# Patient Record
Sex: Female | Born: 1985 | Race: White | Hispanic: No | Marital: Married | Smoking: Never smoker
Health system: Southern US, Community
[De-identification: ages and names within clinical notes are randomized; demographics above are authoritative.]

## PROBLEM LIST (undated history)

## (undated) HISTORY — PX: APPENDECTOMY: SHX54

---

## 2009-03-21 DIAGNOSIS — J45909 Unspecified asthma, uncomplicated: Secondary | ICD-10-CM | POA: Insufficient documentation

## 2017-01-14 ENCOUNTER — Ambulatory Visit: Payer: Self-pay | Admitting: Medical

## 2017-01-14 ENCOUNTER — Encounter: Payer: Self-pay | Admitting: Medical

## 2017-01-14 VITALS — BP 118/82 | HR 95 | Temp 98.1°F | Resp 16 | Ht 67.0 in | Wt 117.0 lb

## 2017-01-14 DIAGNOSIS — M5442 Lumbago with sciatica, left side: Principal | ICD-10-CM

## 2017-01-14 DIAGNOSIS — G8929 Other chronic pain: Secondary | ICD-10-CM

## 2017-01-14 NOTE — Progress Notes (Signed)
   Subjective:    Patient ID: Selena Hughes, female    DOB: 08/07/1986, 31 y.o.   MRN: [id number]  HPI 31 yo female here for MRI order with [facility] imaging. She has a history of lower back pain with tingling on lateral side of left thigh and tingling on lateral portion of her left foot. No numbness or loss of bowel or bladder. Sitting long or in a "bad chair" increases the back pain.    Review of Systems  Musculoskeletal: Positive for back pain.  see HPI     Objective:   Physical Exam  Constitutional: She is oriented to person, place, and time. She appears well-developed and well-nourished.  HENT:  Head: Normocephalic and atraumatic.  Eyes: EOM are normal. Pupils are equal, round, and reactive to light.  Neurological: She is alert and oriented to person, place, and time.  Skin: Skin is warm and dry.  Nursing note and vitals reviewed.  Pateint standing in room.Easily sits down on exam table, 5/5 strength on flexion and extension of lower legs, neg SLR bilaterally.1-2 + popliteal reflexes ( patient had a hard time relaxing.)          Assessment & Plan:  Lumbar back pain. Will order MRI on lumbar  back per [facility] spine center.

## 2017-01-14 NOTE — Progress Notes (Signed)
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  Ruptured disc at L4-5 now symptoms worsening and needs evaluation.  Referred to [facility] by our office.  PA at [facility] ordered MRI but will cost pt about $1000 and she would like referral to [facility] imaging for MRI at lower cost.

## 2017-01-23 ENCOUNTER — Encounter: Payer: Self-pay | Admitting: Radiology

## 2017-01-23 ENCOUNTER — Ambulatory Visit
Admission: RE | Admit: 2017-01-23 | Discharge: 2017-01-23 | Disposition: A | Discharge status: Home / Self Care | Payer: BLUE CROSS/BLUE SHIELD | Source: Ambulatory Visit | Attending: Medical | Admitting: Medical

## 2017-01-23 DIAGNOSIS — G8929 Other chronic pain: Secondary | ICD-10-CM

## 2017-01-23 DIAGNOSIS — M5442 Lumbago with sciatica, left side: Principal | ICD-10-CM

## 2017-01-25 NOTE — Progress Notes (Signed)
Messaged pt to make sure she was aware of MRI results and to f/u with her spine Dr. In [facility].  She voiced that she was and had no other questions.  BA

## 2017-01-26 NOTE — Progress Notes (Signed)
Per pt, Dr. Isidore Moos in [place] has different fax #.  Refaxed MRI results to April Wikstrom, MD. [phone].  BA

## 2017-01-28 DIAGNOSIS — K3580 Unspecified acute appendicitis: Secondary | ICD-10-CM | POA: Insufficient documentation

## 2017-02-21 ENCOUNTER — Ambulatory Visit: Payer: Self-pay | Admitting: Medical

## 2017-02-21 ENCOUNTER — Encounter: Payer: Self-pay | Admitting: Medical

## 2017-02-21 VITALS — BP 142/92 | HR 101 | Temp 99.4°F | Resp 16 | Ht 68.0 in | Wt 117.0 lb

## 2017-02-21 DIAGNOSIS — R059 Cough, unspecified: Secondary | ICD-10-CM

## 2017-02-21 DIAGNOSIS — R05 Cough: Secondary | ICD-10-CM

## 2017-02-21 DIAGNOSIS — J0110 Acute frontal sinusitis, unspecified: Secondary | ICD-10-CM

## 2017-02-21 DIAGNOSIS — J069 Acute upper respiratory infection, unspecified: Secondary | ICD-10-CM

## 2017-02-21 MED ORDER — AMOXICILLIN 875 MG PO TABS: 875.0000 mg | ORAL_TABLET | Freq: Two times a day (BID) | ORAL | 0 refills | Status: DC

## 2017-02-21 MED ORDER — BENZONATATE 100 MG PO CAPS: 200.0000 mg | ORAL_CAPSULE | Freq: Three times a day (TID) | ORAL | 0 refills | Status: DC | PRN

## 2017-02-21 NOTE — Progress Notes (Signed)
   Subjective:    Patient ID: Selena Hughes, female    DOB: [birth year], 31 y.o.   MRN: [id number]  HPI 31 yo female started on 02/11/2017 with st and chest congestion.  Cough now  productive clear with some green , nasal discharge mixed with green. Ears feeling clogged.She feels it has moved down into her chest more.   Patient shares with me she had a steroid injection into her back  on 02/18/2017 for tingling in leg.She  also had a Lap appy on  02/01/2017. Taking Claritin D and phenylephrine, Robitussin with cough suppressant and expectorant.    Review of Systems  Constitutional: Positive for fever. Negative for chills.  HENT: Positive for congestion, sinus pain, sinus pressure and sneezing. Negative for ear pain and tinnitus.   Eyes: Negative for discharge and itching.  Respiratory: Positive for cough. Negative for shortness of breath and wheezing.   Cardiovascular: Negative for chest pain.  Gastrointestinal: Negative for diarrhea, nausea and vomiting.  Endocrine: Negative for polydipsia, polyphagia and polyuria.  Genitourinary: Negative for hematuria.  Musculoskeletal: Negative for back pain.       Objective:   Physical Exam  Constitutional: She is oriented to person, place, and time. She appears well-developed and well-nourished.  HENT:  Head: Normocephalic and atraumatic.  Right Ear: External ear normal.  Left Ear: External ear normal.  Eyes: Conjunctivae and EOM are normal. Pupils are equal, round, and reactive to light.  Neck: Normal range of motion. Neck supple.  Cardiovascular: Normal heart sounds.  A regularly irregular rhythm present. Tachycardia present.   Pulmonary/Chest: Effort normal and breath sounds normal. She has no wheezes. She has no rales.  Lymphadenopathy:    She has no cervical adenopathy.  Neurological: She is alert and oriented to person, place, and time.  Skin: Skin is warm and dry.  Nursing note and vitals reviewed.         Assessment & Plan:   Sinusitis / URI Amoxil  875 mg  One tablet by mouth twice daily for 10 days #20 no refills. Cough Benzonatate 100 mg two capsules by mouth every  8 hours as needed for cough.#30 no refills. Stop Robitussin patent says it is not working that well. Try to stop Claritin -D and use plain Claritin may take one in the am and one in the pm and stop phenylephrine. Reviewed with patient that both sudafed and phenylephrine  may cause blood pressure and heart rate to be elevated and not to use them together. Patient is tall and thin and either medication may be too much for this patients body mass. Try OTC Ibuprofen 200 mg take 4 tablets ( 800 mg) every 8 hours as needed for headache, take with food. Can continue to use Flonase.  Nasal spray as directed. Return to the clinic in  3-5 days if not impoving.

## 2017-03-14 ENCOUNTER — Other Ambulatory Visit: Payer: Self-pay

## 2017-03-15 ENCOUNTER — Other Ambulatory Visit: Payer: Self-pay | Admitting: *Deleted

## 2017-03-15 DIAGNOSIS — Z0000 Encounter for general adult medical examination without abnormal findings: Secondary | ICD-10-CM

## 2017-03-16 LAB — CMP12+LP+TP+TSH+6AC+CBC/D/PLT
ALBUMIN: 4 g/dL (ref 3.5–5.5)
ALT: 13 IU/L (ref 0–32)
AST: 16 IU/L (ref 0–40)
Albumin/Globulin Ratio: 1.7 (ref 1.2–2.2)
Alkaline Phosphatase: 41 IU/L (ref 39–117)
BASOS ABS: 0 10*3/uL (ref 0.0–0.2)
BUN / CREAT RATIO: 9 (ref 9–23)
BUN: 9 mg/dL (ref 6–20)
Basos: 1 %
Bilirubin Total: 0.3 mg/dL (ref 0.0–1.2)
CALCIUM: 9.2 mg/dL (ref 8.7–10.2)
CHOLESTEROL TOTAL: 185 mg/dL (ref 100–199)
CREATININE: 0.98 mg/dL (ref 0.57–1.00)
Chloride: 105 mmol/L (ref 96–106)
Chol/HDL Ratio: 2.6 ratio (ref 0.0–4.4)
EOS (ABSOLUTE): 0.2 10*3/uL (ref 0.0–0.4)
EOS: 4 %
Free Thyroxine Index: 2.5 (ref 1.2–4.9)
GFR calc Af Amer: 89 mL/min/{1.73_m2} (ref >59)
GFR, EST NON AFRICAN AMERICAN: 77 mL/min/{1.73_m2} (ref >59)
GGT: 22 IU/L (ref 0–60)
Globulin, Total: 2.4 g/dL (ref 1.5–4.5)
Glucose: 95 mg/dL (ref 65–99)
HDL: 72 mg/dL (ref >39)
Hematocrit: 40.2 % (ref 34.0–46.6)
Hemoglobin: 13.3 g/dL (ref 11.1–15.9)
IRON: 69 ug/dL (ref 27–159)
Immature Grans (Abs): 0 10*3/uL (ref 0.0–0.1)
Immature Granulocytes: 0 %
LDH: 173 IU/L (ref 119–226)
LDL Calculated: 92 mg/dL (ref 0–99)
LYMPHS ABS: 1 10*3/uL (ref 0.7–3.1)
Lymphs: 27 %
MCH: 29.1 pg (ref 26.6–33.0)
MCHC: 33.1 g/dL (ref 31.5–35.7)
MCV: 88 fL (ref 79–97)
MONOS ABS: 0.2 10*3/uL (ref 0.1–0.9)
Monocytes: 6 %
Neutrophils Absolute: 2.4 10*3/uL (ref 1.4–7.0)
Neutrophils: 62 %
PHOSPHORUS: 4 mg/dL (ref 2.5–4.5)
PLATELETS: 187 10*3/uL (ref 150–379)
Potassium: 4.1 mmol/L (ref 3.5–5.2)
RBC: 4.57 x10E6/uL (ref 3.77–5.28)
RDW: 13.3 % (ref 12.3–15.4)
Sodium: 141 mmol/L (ref 134–144)
T3 UPTAKE RATIO: 26 % (ref 24–39)
T4 TOTAL: 9.5 ug/dL (ref 4.5–12.0)
TOTAL PROTEIN: 6.4 g/dL (ref 6.0–8.5)
TSH: 1.98 u[IU]/mL (ref 0.450–4.500)
Triglycerides: 107 mg/dL (ref 0–149)
URIC ACID: 5.1 mg/dL (ref 2.5–7.1)
VLDL Cholesterol Cal: 21 mg/dL (ref 5–40)
WBC: 3.8 10*3/uL (ref 3.4–10.8)

## 2017-03-16 LAB — VITAMIN D 25 HYDROXY (VIT D DEFICIENCY, FRACTURES): VIT D 25 HYDROXY: 45.1 ng/mL (ref 30.0–100.0)

## 2017-12-19 ENCOUNTER — Other Ambulatory Visit: Payer: Self-pay

## 2017-12-21 ENCOUNTER — Other Ambulatory Visit: Payer: Self-pay

## 2017-12-21 DIAGNOSIS — M542 Cervicalgia: Principal | ICD-10-CM

## 2017-12-21 DIAGNOSIS — H04123 Dry eye syndrome of bilateral lacrimal glands: Secondary | ICD-10-CM

## 2017-12-21 DIAGNOSIS — M2550 Pain in unspecified joint: Secondary | ICD-10-CM

## 2017-12-21 DIAGNOSIS — G8929 Other chronic pain: Secondary | ICD-10-CM

## 2017-12-27 LAB — CBC WITH DIFFERENTIAL/PLATELET
BASOS ABS: 0 10*3/uL (ref 0.0–0.2)
Basos: 1 %
EOS (ABSOLUTE): 0.1 10*3/uL (ref 0.0–0.4)
Eos: 2 %
Hematocrit: 42.3 % (ref 34.0–46.6)
Hemoglobin: 13.8 g/dL (ref 11.1–15.9)
IMMATURE GRANULOCYTES: 0 %
Immature Grans (Abs): 0 10*3/uL (ref 0.0–0.1)
LYMPHS ABS: 1.3 10*3/uL (ref 0.7–3.1)
Lymphs: 32 %
MCH: 30 pg (ref 26.6–33.0)
MCHC: 32.6 g/dL (ref 31.5–35.7)
MCV: 92 fL (ref 79–97)
MONOS ABS: 0.4 10*3/uL (ref 0.1–0.9)
Monocytes: 9 %
NEUTROS PCT: 56 %
Neutrophils Absolute: 2.4 10*3/uL (ref 1.4–7.0)
PLATELETS: 181 10*3/uL (ref 150–379)
RBC: 4.6 x10E6/uL (ref 3.77–5.28)
RDW: 13.5 % (ref 12.3–15.4)
WBC: 4.1 10*3/uL (ref 3.4–10.8)

## 2017-12-27 LAB — COMPREHENSIVE METABOLIC PANEL
A/G RATIO: 2 (ref 1.2–2.2)
ALK PHOS: 40 IU/L (ref 39–117)
ALT: 14 IU/L (ref 0–32)
AST: 18 IU/L (ref 0–40)
Albumin: 4.3 g/dL (ref 3.5–5.5)
BUN/Creatinine Ratio: 12 (ref 9–23)
BUN: 12 mg/dL (ref 6–20)
Bilirubin Total: 0.3 mg/dL (ref 0.0–1.2)
CALCIUM: 9.4 mg/dL (ref 8.7–10.2)
CO2: 23 mmol/L (ref 20–29)
CREATININE: 0.98 mg/dL (ref 0.57–1.00)
Chloride: 104 mmol/L (ref 96–106)
GFR calc Af Amer: 88 mL/min/{1.73_m2} (ref >59)
GFR calc non Af Amer: 77 mL/min/{1.73_m2} (ref >59)
Globulin, Total: 2.2 g/dL (ref 1.5–4.5)
Glucose: 64 mg/dL — ABNORMAL LOW (ref 65–99)
POTASSIUM: 4.4 mmol/L (ref 3.5–5.2)
SODIUM: 141 mmol/L (ref 134–144)
Total Protein: 6.5 g/dL (ref 6.0–8.5)

## 2017-12-27 LAB — ANA: ANA: NEGATIVE

## 2017-12-27 LAB — RHEUMATOID FACTOR

## 2017-12-27 LAB — HLA-B27 ANTIGEN: HLA B27: NEGATIVE

## 2018-02-15 ENCOUNTER — Other Ambulatory Visit: Payer: Self-pay | Admitting: Family Medicine

## 2018-02-15 DIAGNOSIS — M5412 Radiculopathy, cervical region: Secondary | ICD-10-CM

## 2018-02-15 DIAGNOSIS — M542 Cervicalgia: Secondary | ICD-10-CM

## 2018-02-17 ENCOUNTER — Other Ambulatory Visit: Payer: Self-pay | Admitting: Family Medicine

## 2018-02-17 DIAGNOSIS — R200 Anesthesia of skin: Secondary | ICD-10-CM

## 2018-02-17 DIAGNOSIS — B354 Tinea corporis: Secondary | ICD-10-CM

## 2018-02-28 ENCOUNTER — Ambulatory Visit
Admission: RE | Admit: 2018-02-28 | Discharge: 2018-02-28 | Disposition: A | Discharge status: Home / Self Care | Payer: BLUE CROSS/BLUE SHIELD | Source: Ambulatory Visit | Attending: Family Medicine | Admitting: Family Medicine

## 2018-02-28 DIAGNOSIS — R200 Anesthesia of skin: Secondary | ICD-10-CM

## 2018-02-28 DIAGNOSIS — B354 Tinea corporis: Secondary | ICD-10-CM

## 2018-02-28 MED ORDER — GADOBENATE DIMEGLUMINE 529 MG/ML IV SOLN
11.0000 mL | Freq: Once | INTRAVENOUS | Status: AC | PRN
  Administered 2018-02-28: 11 mL via INTRAVENOUS

## 2018-03-15 DIAGNOSIS — M5020 Other cervical disc displacement, unspecified cervical region: Secondary | ICD-10-CM | POA: Insufficient documentation

## 2018-03-15 DIAGNOSIS — M5136 Other intervertebral disc degeneration, lumbar region: Secondary | ICD-10-CM | POA: Insufficient documentation

## 2018-03-15 DIAGNOSIS — F419 Anxiety disorder, unspecified: Secondary | ICD-10-CM | POA: Insufficient documentation

## 2018-03-15 DIAGNOSIS — M542 Cervicalgia: Secondary | ICD-10-CM

## 2018-03-15 DIAGNOSIS — M21612 Bunion of left foot: Secondary | ICD-10-CM

## 2018-03-15 DIAGNOSIS — G8929 Other chronic pain: Secondary | ICD-10-CM | POA: Insufficient documentation

## 2018-03-15 DIAGNOSIS — M21611 Bunion of right foot: Secondary | ICD-10-CM | POA: Insufficient documentation

## 2018-06-22 DIAGNOSIS — L301 Dyshidrosis [pompholyx]: Secondary | ICD-10-CM | POA: Insufficient documentation

## 2018-08-23 ENCOUNTER — Ambulatory Visit: Payer: Self-pay | Admitting: Medical

## 2018-08-23 ENCOUNTER — Encounter: Payer: Self-pay | Admitting: Medical

## 2018-08-23 VITALS — BP 141/84 | HR 71 | Temp 97.4°F | Resp 16 | Wt 119.6 lb

## 2018-08-23 DIAGNOSIS — M26609 Unspecified temporomandibular joint disorder, unspecified side: Secondary | ICD-10-CM | POA: Insufficient documentation

## 2018-08-23 DIAGNOSIS — Z3A01 Less than 8 weeks gestation of pregnancy: Secondary | ICD-10-CM

## 2018-08-23 NOTE — Progress Notes (Signed)
   Subjective:    Patient ID: Selena Hughes, female    DOB: [birth year], 32 y.o.   MRN: [id number]  HPI 32 yo female in non acute distress. Comes in today for a pregnancy test. She has done a home pregnancy test and it was positive. Last LMP Jul 12, 2018.  Blood pressure (!) 141/84, pulse 71, temperature (!) 97.4 F (36.3 C), temperature source Tympanic, resp. rate 16, weight 119 lb 9.6 oz (54.3 kg), last menstrual period 07/11/2018, SpO2 100 %.  Review of Systems  HENT: Positive for congestion (allergies).   Eyes: Negative for discharge and itching.  Respiratory: Negative for cough and shortness of breath.   Cardiovascular: Negative for chest pain.  Gastrointestinal: Positive for abdominal pain (stomach, sensitive more so since pregancy, cramping, soreness in sides) and nausea. Negative for constipation, diarrhea and vomiting.  Endocrine: Negative for polydipsia, polyphagia and polyuria.  Genitourinary: Negative for dysuria.  Musculoskeletal: Negative for myalgias.  Skin: Positive for rash (history of eczema some on hands and  left flank and back).  Allergic/Immunologic: Positive for environmental allergies. Negative for food allergies.  Neurological: Negative for dizziness, syncope, light-headedness and headaches.  Hematological: Negative for adenopathy.  Psychiatric/Behavioral: Negative for behavioral problems, confusion, self-injury and suicidal ideas.       Objective:   Physical Exam  Constitutional: She is oriented to person, place, and time. She appears well-developed and well-nourished.  HENT:  Head: Normocephalic and atraumatic.  Eyes: Pupils are equal, round, and reactive to light. Conjunctivae and EOM are normal.  Neck: Normal range of motion. Neck supple.  Neurological: She is alert and oriented to person, place, and time.  Skin: Skin is warm and dry.  Psychiatric: She has a normal mood and affect. Her behavior is normal. Judgment and thought content normal.  Nursing  note and vitals reviewed.           Assessment & Plan:  Pregnancy  [facility] Information given on eating, she is taking Folic acid, but not a MVI.Marland Kitchen. Stop Flonase for now, check with OB/GYN/ May use Claritin if needed.  Follow up with your OB/GYN. Patient verbalizes understanding and has no questions at discharge. Return to clinic as needed.

## 2018-08-23 NOTE — Patient Instructions (Signed)
Eating Plan for Pregnant Women While you are pregnant, your body will require additional nutrition to help support your growing baby. It is recommended that you consume:  150 additional calories each day during your first trimester.  300 additional calories each day during your second trimester.  300 additional calories each day during your third trimester.  Eating a healthy, well-balanced diet is very important for your health and for your baby's health. You also have a higher need for some vitamins and minerals, such as folic acid, calcium, iron, and vitamin D. What do I need to know about eating during pregnancy?  Do not try to lose weight or go on a diet during pregnancy.  Choose healthy, nutritious foods. Choose  of a sandwich with a glass of milk instead of a candy bar or a high-calorie sugar-sweetened beverage.  Limit your overall intake of foods that have "empty calories." These are foods that have little nutritional value, such as sweets, desserts, candies, sugar-sweetened beverages, and fried foods.  Eat a variety of foods, especially fruits and vegetables.  Take a prenatal vitamin to help meet the additional needs during pregnancy, specifically for folic acid, iron, calcium, and vitamin D.  Remember to stay active. Ask your health care provider for exercise recommendations that are specific to you.  Practice good food safety and cleanliness, such as washing your hands before you eat and after you prepare raw meat. This helps to prevent foodborne illnesses, such as listeriosis, that can be very dangerous for your baby. Ask your health care provider for more information about listeriosis. What does 150 extra calories look like? Healthy options for an additional 150 calories each day could be any of the following:  Plain low-fat yogurt (6-8 oz) with  cup of berries.  1 apple with 2 teaspoons of peanut butter.  Cut-up vegetables with  cup of hummus.  Low-fat chocolate  milk (8 oz or 1 cup).  1 string cheese with 1 medium orange.   of a peanut butter and jelly sandwich on whole-wheat bread (1 tsp of peanut butter).  For 300 calories, you could eat two of those healthy options each day. What is a healthy amount of weight to gain? The recommended amount of weight for you to gain is based on your pre-pregnancy BMI. If your pre-pregnancy BMI was:  Less than 18 (underweight), you should gain 28-40 lb.  18-24.9 (normal), you should gain 25-35 lb.  25-29.9 (overweight), you should gain 15-25 lb.  Greater than 30 (obese), you should gain 11-20 lb.  What if I am having twins or multiples? Generally, pregnant women who will be having twins or multiples may need to increase their daily calories by 300-600 calories each day. The recommended range for total weight gain is 25-54 lb, depending on your pre-pregnancy BMI. Talk with your health care provider for specific guidance about additional nutritional needs, weight gain, and exercise during your pregnancy. What foods can I eat? Grains Any grains. Try to choose whole grains, such as whole-wheat bread, oatmeal, or brown rice. Vegetables Any vegetables. Try to eat a variety of colors and types of vegetables to get a full range of vitamins and minerals. Remember to wash your vegetables well before eating. Fruits Any fruits. Try to eat a variety of colors and types of fruit to get a full range of vitamins and minerals. Remember to wash your fruits well before eating. Meats and Other Protein Sources Lean meats, including chicken, turkey, fish, and lean cuts of beef, veal,   or pork. Make sure that all meats are cooked to "well done." Tofu. Tempeh. Beans. Eggs. Peanut butter and other nut butters. Seafood, such as shrimp, crab, and lobster. If you choose fish, select types that are higher in omega-3 fatty acids, including salmon, herring, mussels, trout, sardines, and pollock. Make sure that all meats are cooked to  food-safe temperatures. Dairy Pasteurized milk and milk alternatives. Pasteurized yogurt and pasteurized cheese. Cottage cheese. Sour cream. Beverages Water. Juices that contain 100% fruit juice or vegetable juice. Caffeine-free teas and decaffeinated coffee. Drinks that contain caffeine are okay to drink, but it is better to avoid caffeine. Keep your total caffeine intake to less than 200 mg each day (12 oz of coffee, tea, or soda) or as directed by your health care provider. Condiments Any pasteurized condiments. Sweets and Desserts Any sweets and desserts. Fats and Oils Any fats and oils. The items listed above may not be a complete list of recommended foods or beverages. Contact your dietitian for more options. What foods are not recommended? Vegetables Unpasteurized (raw) vegetable juices. Fruits Unpasteurized (raw) fruit juices. Meats and Other Protein Sources Cured meats that have nitrates, such as bacon, salami, and hotdogs. Luncheon meats, bologna, or other deli meats (unless they are reheated until they are steaming hot). Refrigerated pate, meat spreads from a meat counter, smoked seafood that is found in the refrigerated section of a store. Raw fish, such as sushi or sashimi. High mercury content fish, such as tilefish, shark, swordfish, and king mackerel. Raw meats, such as tuna or beef tartare. Undercooked meats and poultry. Make sure that all meats are cooked to food-safe temperatures. Dairy Unpasteurized (raw) milk and any foods that have raw milk in them. Soft cheeses, such as feta, queso blanco, queso fresco, Brie, Camembert cheeses, blue-veined cheeses, and Panela cheese (unless it is made with pasteurized milk, which must be stated on the label). Beverages Alcohol. Sugar-sweetened beverages, such as sodas, teas, or energy drinks. Condiments Homemade fermented foods and drinks, such as pickles, sauerkraut, or kombucha drinks. (Store-bought pasteurized versions of these are  okay.) Other Salads that are made in the store, such as ham salad, chicken salad, egg salad, tuna salad, and seafood salad. The items listed above may not be a complete list of foods and beverages to avoid. Contact your dietitian for more information. This information is not intended to replace advice given to you by your health care provider. Make sure you discuss any questions you have with your health care provider. Document Released: 07/12/2014 Document Revised: 03/04/2016 Document Reviewed: 03/12/2014 Elsevier Interactive Patient Education  2018 Elsevier Inc.   

## 2019-06-15 DIAGNOSIS — Z975 Presence of (intrauterine) contraceptive device: Secondary | ICD-10-CM | POA: Insufficient documentation

## 2019-07-09 DIAGNOSIS — M2021 Hallux rigidus, right foot: Secondary | ICD-10-CM | POA: Insufficient documentation

## 2019-12-21 ENCOUNTER — Other Ambulatory Visit: Payer: Self-pay | Admitting: Physical Medicine and Rehabilitation

## 2019-12-21 DIAGNOSIS — M4807 Spinal stenosis, lumbosacral region: Secondary | ICD-10-CM

## 2020-01-03 ENCOUNTER — Ambulatory Visit
Admission: RE | Admit: 2020-01-03 | Discharge: 2020-01-03 | Disposition: A | Discharge status: Home / Self Care | Payer: BC Managed Care – PPO | Source: Ambulatory Visit | Attending: Physical Medicine and Rehabilitation | Admitting: Physical Medicine and Rehabilitation

## 2020-01-03 ENCOUNTER — Other Ambulatory Visit: Payer: Self-pay

## 2020-01-03 DIAGNOSIS — M4807 Spinal stenosis, lumbosacral region: Secondary | ICD-10-CM

## 2020-06-19 ENCOUNTER — Telehealth: Payer: Self-pay | Admitting: Medical

## 2020-06-19 ENCOUNTER — Telehealth: Payer: Self-pay

## 2020-06-19 NOTE — Telephone Encounter (Signed)
Pt called stating she started with a head cold last Friday, 9/3; PCR test done Friday and was negative; using Sudafed and Nyquil; vaccinated; denies any fever, cough, shortness of breath; daughter also has symptoms and being treated for a bacterial infection and on antibiotics; daughter's RSV and covid tests were negative as well yesterday; denies any exposure to anyone with Covid; spoke with H.[facility] PAC and virtual visit offered; pt unable to have virtual visit today; pt made virtual appointment on Friday morning

## 2020-06-20 ENCOUNTER — Other Ambulatory Visit: Payer: Self-pay

## 2020-06-20 ENCOUNTER — Encounter: Payer: Self-pay | Admitting: Registered Nurse

## 2020-06-20 ENCOUNTER — Telehealth: Payer: BC Managed Care – PPO | Admitting: Registered Nurse

## 2020-06-20 DIAGNOSIS — J019 Acute sinusitis, unspecified: Secondary | ICD-10-CM

## 2020-06-20 MED ORDER — AMOXICILLIN 875 MG PO TABS: 875.0000 mg | ORAL_TABLET | Freq: Two times a day (BID) | ORAL | 0 refills | Status: AC

## 2020-06-20 MED ORDER — FLUTICASONE PROPIONATE 50 MCG/ACT NA SUSP: 1.0000 | Freq: Two times a day (BID) | NASAL | 0 refills | Status: AC

## 2020-06-20 MED ORDER — AFRIN NASAL SPRAY 0.05 % NA SOLN: 1.0000 | Freq: Two times a day (BID) | NASAL | 0 refills | Status: AC

## 2020-06-20 MED ORDER — SALINE SPRAY 0.65 % NA SOLN: 2.0000 | NASAL | 0 refills | Status: AC

## 2020-06-20 NOTE — Progress Notes (Signed)
Subjective:    Patient ID: Selena Hughes, female    DOB: [birth year], 34 y.o.   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  She does walk around and interact with students in classroom but not closer than 6 feet greater than 15 minutes per 24 hours with each individual student.  Masks are worn by faculty and students.  She hasn't had close contact in office.  Nasal congestion and pressure in head Monday started and building throughout the week.  Initially mucous clear to yellow now yellow green.  Daughter pink eye last Thursday at daycare went away came back, Tuesday fever at daycare, pediatrician Wed diagnosed otitis negative for RSV/covid/flu  Has tried claritin D, flonase.  Denied cough issues.  Denied breastfeeding.  During allergy season uses plain claritin prn po daily.  Denied loss of taste/smell, body aches, fever, chills, dyspnea/sob/wheezing/chest pain.  Ears are clogged up.  Patient consented to video/telephone visit.  This visit was conducted entirely via telephone.  I spent 17.5 minutes on the telephone with patient.  Patient was unable to connect via video. The visit was completed via audio only.     Review of Systems  Constitutional: Negative for activity change, appetite change, chills, diaphoresis, fatigue and fever.  HENT: Positive for congestion, postnasal drip, rhinorrhea, sinus pressure, sinus pain and sore throat. Negative for ear discharge, ear pain, mouth sores, trouble swallowing and voice change.   Eyes: Negative for photophobia and visual disturbance.  Respiratory: Negative for cough, shortness of breath, wheezing and stridor.   Cardiovascular: Negative for chest pain.  Gastrointestinal: Negative for diarrhea and  vomiting.  Endocrine: Negative for cold intolerance and heat intolerance.  Musculoskeletal: Negative for arthralgias, gait problem, myalgias, neck pain and neck stiffness.  Allergic/Immunologic: Positive for environmental allergies. Negative for food allergies.  Neurological: Negative for dizziness, tremors, syncope, facial asymmetry, speech difficulty, weakness, light-headedness and headaches.  Hematological: Negative for adenopathy. Does not bruise/bleed easily.  Psychiatric/Behavioral: Negative for agitation, confusion and sleep disturbance.       Objective:   Physical Exam Nursing note reviewed.  Constitutional:      General: She is awake. She is not in acute distress. HENT:     Right Ear: Hearing normal.     Left Ear: Hearing normal.     Nose: Mucosal edema, congestion and rhinorrhea present.  Eyes:     General: Vision grossly intact.  Neck:     Trachea: Trachea and phonation normal.  Pulmonary:     Effort: Pulmonary effort is normal. No respiratory distress.     Breath sounds: Normal breath sounds and air entry. No stridor. No wheezing.     Comments: Patient spoke full sentences without difficulty; no cough audible during telephone conversation.  Frequent nasal sniffing noted/audible. Neurological:     Mental Status: She is alert and oriented to person, place, and time.  Psychiatric:        Attention and Perception: Attention and perception normal.        Mood and Affect: Mood normal.        Speech: Speech normal.        Behavior: Behavior normal. Behavior is cooperative.        Thought Content: Thought content normal.        Cognition  and Memory: Cognition and memory normal.        Judgment: Judgment normal.           Assessment & Plan:  A-acute rhinosinusitis, eustachian tube dysfunction bilateral  P-Restart flonase 1 spray each nostril BID, saline 2 sprays each nostril q2h wa prn congestion OTC/at home supply.  If no improvement with 48 hours of saline and  flonase use start amoxicillin 875mg  po BID x 10 days #20 RF0 electronic Rx to her pharmacy of choice.  Discussed administration nasal saline first wait 15-30 minutes then blow nose.  Should feel liquid run down back of throat.  Then instill flonase light inhale shouldn't feel/taste liquid in back of throat if yes sniffed/inhaled too hard try again lighter inhale.  Goal to have medicine stay in nostrils/sinuses.  Denied personal or family history of ENT cancer.  Shower BID especially prior to bed. No evidence of systemic bacterial infection, non toxic and well hydrated.  I do not see where any further testing or imaging is necessary at this time.   I will suggest supportive care, rest, good hygiene and encourage the patient to take adequate fluids.  The patient is to return to clinic or EMERGENCY ROOM if symptoms worsen or change significantly.  Patient denied need for work excuse.  Exitcare handout on covid 19, viral URI,  sinusitis and sinus rinse sent to patient my chart.  Patient verbalized agreement and understanding of treatment plan and had no further questions at this time.   P2:  Hand washing and cover cough   No evidence of invasive bacterial infection, non toxic and well hydrated.  I do not see where any further testing or imaging is necessary at this time.   I will suggest supportive care, rest, good hygiene and encourage the patient to take adequate fluids.  The patient is to return to clinic or EMERGENCY ROOM if symptoms worsen or change significantly e.g. ear pain, fever, purulent discharge from ears or bleeding.  Exitcare handout on eustachian tube dysfunction sent to my chart.  Post nasal drip irritates throat/causes swelling blocks eustachian tubes from draining and fluid fills up middle ear.  Bacteria/viruses can grow in fluid and with moving head tube compressed and increases pressure in tube/ear worsening pain.  Studies show will take 30 days for fluid to resolve after post nasal drip  controlled with nasal steroid/antihistamine. Antibiotics and steroids do not speed up fluid removal.  Patient verbalized agreement and understanding of treatment plan and had no further questions at this time.  Discussed high rate of transmission in community.  Fully vaccinated.  Test last week day 1 symptoms negative.  Daughter day 7 symptoms negative for flu/RSV/covid.  Monitor for symptoms of covid e.g. runny nose, sore throat, headache, loss of taste/smell, nausea/vomiting/diarrhea, fever/chills, body aches, cough, shortness of breath/dyspnea. Patient to notify clinic staff if new symptoms develop especially difficulty breathing, loss of taste/smell, body aches, and/or fever.  Consider repeat covid testing if these symptoms occur.  Discussed clinic open until 1200 today and she can send me my chart message over weekend if new concerns. Current guidance states test day 3-5 of covid symptoms with delta variant.  Continue to protect self with mask wear, hand sanitizing washing, avoid touching face and maintaining social distancing 6 feet or greater.  Patient verbalized understanding of information/instructions, agreed with plan of care and had no further questions at this time.

## 2020-06-20 NOTE — Patient Instructions (Addendum)
Eustachian Tube Dysfunction  Eustachian tube dysfunction refers to a condition in which a blockage develops in the narrow passage that connects the middle ear to the back of the nose (eustachian tube). The eustachian tube regulates air pressure in the middle ear by letting air move between the ear and nose. It also helps to drain fluid from the middle ear space. Eustachian tube dysfunction can affect one or both ears. When the eustachian tube does not function properly, air pressure, fluid, or both can build up in the middle ear. What are the causes? This condition occurs when the eustachian tube becomes blocked or cannot open normally. Common causes of this condition include:  Ear infections.  Colds and other infections that affect the nose, mouth, and throat (upper respiratory tract).  Allergies.  Irritation from cigarette smoke.  Irritation from stomach acid coming up into the esophagus (gastroesophageal reflux). The esophagus is the tube that carries food from the mouth to the stomach.  Sudden changes in air pressure, such as from descending in an airplane or scuba diving.  Abnormal growths in the nose or throat, such as: ? Growths that line the nose (nasal polyps). ? Abnormal growth of cells (tumors). ? Enlarged tissue at the back of the throat (adenoids). What increases the risk? You are more likely to develop this condition if:  You smoke.  You are overweight.  You are a child who has: ? Certain birth defects of the mouth, such as cleft palate. ? Large tonsils or adenoids. What are the signs or symptoms? Common symptoms of this condition include:  A feeling of fullness in the ear.  Ear pain.  Clicking or popping noises in the ear.  Ringing in the ear.  Hearing loss.  Loss of balance.  Dizziness. Symptoms may get worse when the air pressure around you changes, such as when you travel to an area of high elevation, fly on an airplane, or go scuba diving. How is  this diagnosed? This condition may be diagnosed based on:  Your symptoms.  A physical exam of your ears, nose, and throat.  Tests, such as those that measure: ? The movement of your eardrum (tympanogram). ? Your hearing (audiometry). How is this treated? Treatment depends on the cause and severity of your condition.  In mild cases, you may relieve your symptoms by moving air into your ears. This is called "popping the ears."  In more severe cases, or if you have symptoms of fluid in your ears, treatment may include: ? Medicines to relieve congestion (decongestants). ? Medicines that treat allergies (antihistamines). ? Nasal sprays or ear drops that contain medicines that reduce swelling (steroids). ? A procedure to drain the fluid in your eardrum (myringotomy). In this procedure, a small tube is placed in the eardrum to:  Drain the fluid.  Restore the air in the middle ear space. ? A procedure to insert a balloon device through the nose to inflate the opening of the eustachian tube (balloon dilation). Follow these instructions at home: Lifestyle  Do not do any of the following until your health care provider approves: ? Travel to high altitudes. ? Fly in airplanes. ? Work in a pressurized cabin or room. ? Scuba dive.  Do not use any products that contain nicotine or tobacco, such as cigarettes and e-cigarettes. If you need help quitting, ask your health care provider.  Keep your ears dry. Wear fitted earplugs during showering and bathing. Dry your ears completely after. General instructions  Take over-the-counter   and prescription medicines only as told by your health care provider.  Use techniques to help pop your ears as recommended by your health care provider. These may include: ? Chewing gum. ? Yawning. ? Frequent, forceful swallowing. ? Closing your mouth, holding your nose closed, and gently blowing as if you are trying to blow air out of your nose.  Keep all  follow-up visits as told by your health care provider. This is important. Contact a health care provider if:  Your symptoms do not go away after treatment.  Your symptoms come back after treatment.  You are unable to pop your ears.  You have: ? A fever. ? Pain in your ear. ? Pain in your head or neck. ? Fluid draining from your ear.  Your hearing suddenly changes.  You become very dizzy.  You lose your balance. Summary  Eustachian tube dysfunction refers to a condition in which a blockage develops in the eustachian tube.  It can be caused by ear infections, allergies, inhaled irritants, or abnormal growths in the nose or throat.  Symptoms include ear pain, hearing loss, or ringing in the ears.  Mild cases are treated with maneuvers to unblock the ears, such as yawning or ear popping.  Severe cases are treated with medicines. Surgery may also be done (rare). This information is not intended to replace advice given to you by your health care provider. Make sure you discuss any questions you have with your health care provider. Document Revised: 01/17/2018 Document Reviewed: 01/17/2018 Elsevier Patient Education  2020 [employer] Viral Respiratory Infection A respiratory infection is an illness that affects part of the respiratory system, such as the lungs, nose, or throat. A respiratory infection that is caused by a virus is called a viral respiratory infection. Common types of viral respiratory infections include:  A cold.  The flu (influenza).  A respiratory syncytial virus (RSV) infection. What are the causes? This condition is caused by a virus. What are the signs or symptoms? Symptoms of this condition include:  A stuffy or runny nose.  Yellow or green nasal discharge.  A cough.  Sneezing.  Fatigue.  Achy muscles.  A sore throat.  Sweating or chills.  A fever.  A headache. How is this diagnosed? This condition may be diagnosed based  on:  Your symptoms.  A physical exam.  Testing of nasal swabs. How is this treated? This condition may be treated with medicines, such as:  Antiviral medicine. This may shorten the length of time a person has symptoms.  Expectorants. These make it easier to cough up mucus.  Decongestant nasal sprays.  Acetaminophen or NSAIDs to relieve fever and pain. Antibiotic medicines are not prescribed for viral infections. This is because antibiotics are designed to kill bacteria. They are not effective against viruses. Follow these instructions at home:  Managing pain and congestion  Take over-the-counter and prescription medicines only as told by your health care provider.  If you have a sore throat, gargle with a salt-water mixture 3-4 times a day or as needed. To make a salt-water mixture, completely dissolve -1 tsp of salt in 1 cup of warm water.  Use nose drops made from salt water to ease congestion and soften raw skin around your nose.  Drink enough fluid to keep your urine pale yellow. This helps prevent dehydration and helps loosen up mucus. General instructions  Rest as much as possible.  Do not drink alcohol.  Do not use any products that contain nicotine  or tobacco, such as cigarettes and e-cigarettes. If you need help quitting, ask your health care provider.  Keep all follow-up visits as told by your health care provider. This is important. How is this prevented?   Get an annual flu shot. You may get the flu shot in late summer, fall, or winter. Ask your health care provider when you should get your flu shot.  Avoid exposing others to your respiratory infection. ? Stay home from work or school as told by your health care provider. ? Wash your hands with soap and water often, especially after you cough or sneeze. If soap and water are not available, use alcohol-based hand sanitizer.  Avoid contact with people who are sick during cold and flu season. This is generally  fall and winter. Contact a health care provider if:  Your symptoms last for 10 days or longer.  Your symptoms get worse over time.  You have a fever.  You have severe sinus pain in your face or forehead.  The glands in your jaw or neck become very swollen. Get help right away if you:  Feel pain or pressure in your chest.  Have shortness of breath.  Faint or feel like you will faint.  Have severe and persistent vomiting.  Feel confused or disoriented. Summary  A respiratory infection is an illness that affects part of the respiratory system, such as the lungs, nose, or throat. A respiratory infection that is caused by a virus is called a viral respiratory infection.  Common types of viral respiratory infections are a cold, influenza, and respiratory syncytial virus (RSV) infection.  Symptoms of this condition include a stuffy or runny nose, cough, sneezing, fatigue, achy muscles, sore throat, and fevers or chills.  Antibiotic medicines are not prescribed for viral infections. This is because antibiotics are designed to kill bacteria. They are not effective against viruses. This information is not intended to replace advice given to you by your health care provider. Make sure you discuss any questions you have with your health care provider. Document Revised: 10/05/2018 Document Reviewed: 11/07/2017 Elsevier Patient Education  2020 [employer]. How to Perform a Sinus Rinse A sinus rinse is a home treatment that is used to rinse your sinuses with a sterile mixture of salt and water (saline solution). Sinuses are air-filled spaces in your skull behind the bones of your face and forehead that open into your nasal cavity. A sinus rinse can help to clear mucus, dirt, dust, or pollen from your nasal cavity. You may do a sinus rinse when you have a cold, a virus, nasal allergy symptoms, a sinus infection, or stuffiness in your nose or sinuses. Talk with your health care provider about  whether a sinus rinse might help you. What are the risks? A sinus rinse is generally safe and effective. However, there are a few risks, which include:  A burning sensation in your sinuses. This may happen if you do not make the saline solution as directed. Be sure to follow all directions when making the saline solution.  Nasal irritation.  Infection from contaminated water. This is rare, but possible. Do not do a sinus rinse if you have had ear or nasal surgery, ear infection, or blocked ears. Supplies needed:  Saline solution or powder.  Distilled or sterile water may be needed to mix with saline powder. ? You may use boiled and cooled tap water. Boil tap water for 5 minutes; cool until it is lukewarm. Use within 24 hours. ?  Do not use regular tap water to mix with the saline solution.  Neti pot or nasal rinse bottle. These supplies release the saline solution into your nose and through your sinuses. Neti pots and nasal rinse bottles can be purchased at [employer], a health food store, or online. How to perform a sinus rinse  1. Wash your hands with soap and water. 2. Wash your device according to the directions that came with the product and then dry it. 3. Use the solution that comes with your product or one that is sold separately in stores. Follow the mixing directions on the package if you need to mix with sterile or distilled water. 4. Fill the device with the amount of saline solution noted in the device instructions. 5. Stand over a sink and tilt your head sideways over the sink. 6. Place the spout of the device in your upper nostril (the one closer to the ceiling). 7. Gently pour or squeeze the saline solution into your nasal cavity. The liquid should drain out from the lower nostril if you are not too congested. 8. While rinsing, breathe through your open mouth. 9. Gently blow your nose to clear any mucus and rinse solution. Blowing too hard may cause ear  pain. 10. Repeat in your other nostril. 11. Clean and rinse your device with clean water and then air-dry it. Talk with your health care provider or pharmacist if you have questions about how to do a sinus rinse. Summary  A sinus rinse is a home treatment that is used to rinse your sinuses with a sterile mixture of salt and water (saline solution).  A sinus rinse is generally safe and effective. Follow all instructions carefully.  Before doing a sinus rinse, talk with your health care provider about whether it would be helpful for you. This information is not intended to replace advice given to you by your health care provider. Make sure you discuss any questions you have with your health care provider. Document Revised: 07/25/2017 Document Reviewed: 07/25/2017 Elsevier Patient Education  2020 Elsevier Inc. Sinusitis, Adult Sinusitis is inflammation of your sinuses. Sinuses are hollow spaces in the bones around your face. Your sinuses are located:  Around your eyes.  In the middle of your forehead.  Behind your nose.  In your cheekbones. Mucus normally drains out of your sinuses. When your nasal tissues become inflamed or swollen, mucus can become trapped or blocked. This allows bacteria, viruses, and fungi to grow, which leads to infection. Most infections of the sinuses are caused by a virus. Sinusitis can develop quickly. It can last for up to 4 weeks (acute) or for more than 12 weeks (chronic). Sinusitis often develops after a cold. What are the causes? This condition is caused by anything that creates swelling in the sinuses or stops mucus from draining. This includes:  Allergies.  Asthma.  Infection from bacteria or viruses.  Deformities or blockages in your nose or sinuses.  Abnormal growths in the nose (nasal polyps).  Pollutants, such as chemicals or irritants in the air.  Infection from fungi (rare). What increases the risk? You are more likely to develop this  condition if you:  Have a weak body defense system (immune system).  Do a lot of swimming or diving.  Overuse nasal sprays.  Smoke. What are the signs or symptoms? The main symptoms of this condition are pain and a feeling of pressure around the affected sinuses. Other symptoms include:  Stuffy nose or congestion.  Thick drainage from your nose.  Swelling and warmth over the affected sinuses.  Headache.  Upper toothache.  A cough that may get worse at night.  Extra mucus that collects in the throat or the back of the nose (postnasal drip).  Decreased sense of smell and taste.  Fatigue.  A fever.  Sore throat.  Bad breath. How is this diagnosed? This condition is diagnosed based on:  Your symptoms.  Your medical history.  A physical exam.  Tests to find out if your condition is acute or chronic. This may include: ? Checking your nose for nasal polyps. ? Viewing your sinuses using a device that has a light (endoscope). ? Testing for allergies or bacteria. ? Imaging tests, such as an MRI or CT scan. In rare cases, a bone biopsy may be done to rule out more serious types of fungal sinus disease. How is this treated? Treatment for sinusitis depends on the cause and whether your condition is chronic or acute.  If caused by a virus, your symptoms should go away on their own within 10 days. You may be given medicines to relieve symptoms. They include: ? Medicines that shrink swollen nasal passages (topical intranasal decongestants). ? Medicines that treat allergies (antihistamines). ? A spray that eases inflammation of the nostrils (topical intranasal corticosteroids). ? Rinses that help get rid of thick mucus in your nose (nasal saline washes).  If caused by bacteria, your health care provider may recommend waiting to see if your symptoms improve. Most bacterial infections will get better without antibiotic medicine. You may be given antibiotics if you have: ? A  severe infection. ? A weak immune system.  If caused by narrow nasal passages or nasal polyps, you may need to have surgery. Follow these instructions at home: Medicines  Take, use, or apply over-the-counter and prescription medicines only as told by your health care provider. These may include nasal sprays.  If you were prescribed an antibiotic medicine, take it as told by your health care provider. Do not stop taking the antibiotic even if you start to feel better. Hydrate and humidify   Drink enough fluid to keep your urine pale yellow. Staying hydrated will help to thin your mucus.  Use a cool mist humidifier to keep the humidity level in your home above 50%.  Inhale steam for 10-15 minutes, 3-4 times a day, or as told by your health care provider. You can do this in the bathroom while a hot shower is running.  Limit your exposure to cool or dry air. Rest  Rest as much as possible.  Sleep with your head raised (elevated).  Make sure you get enough sleep each night. General instructions   Apply a warm, moist washcloth to your face 3-4 times a day or as told by your health care provider. This will help with discomfort.  Wash your hands often with soap and water to reduce your exposure to germs. If soap and water are not available, use hand sanitizer.  Do not smoke. Avoid being around people who are smoking (secondhand smoke).  Keep all follow-up visits as told by your health care provider. This is important. Contact a health care provider if:  You have a fever.  Your symptoms get worse.  Your symptoms do not improve within 10 days. Get help right away if:  You have a severe headache.  You have persistent vomiting.  You have severe pain or swelling around your face or eyes.  You have vision problems.  You develop confusion.  Your neck is stiff.  You have trouble breathing. Summary  Sinusitis is soreness and inflammation of your sinuses. Sinuses are hollow  spaces in the bones around your face.  This condition is caused by nasal tissues that become inflamed or swollen. The swelling traps or blocks the flow of mucus. This allows bacteria, viruses, and fungi to grow, which leads to infection.  If you were prescribed an antibiotic medicine, take it as told by your health care provider. Do not stop taking the antibiotic even if you start to feel better.  Keep all follow-up visits as told by your health care provider. This is important. This information is not intended to replace advice given to you by your health care provider. Make sure you discuss any questions you have with your health care provider. Document Revised: 02/27/2018 Document Reviewed: 02/27/2018 Elsevier Patient Education  2020 [employer].  COVID-19 Frequently Asked Questions COVID-19 (coronavirus disease) is an infection that is caused by a large family of viruses. Some viruses cause illness in people and others cause illness in animals like camels, cats, and bats. In some cases, the viruses that cause illness in animals can spread to humans. Where did the coronavirus come from? In December 2019, [place] told the [employer] [facility]) of several cases of lung disease (human respiratory illness). These cases were linked to an open seafood and livestock market in the city of [place]. The link to the seafood and livestock market suggests that the virus may have spread from animals to humans. However, since that first outbreak in December, the virus has also been shown to spread from person to person. What is the name of the disease and the virus? Disease name Early on, this disease was called novel coronavirus. This is because scientists determined that the disease was caused by a new (novel) respiratory virus. The World Health Organization [facility]) has now named the disease COVID-19, or coronavirus disease. Virus name The virus that causes the disease is called severe acute  respiratory syndrome coronavirus 2 (SARS-CoV-2). More information on disease and virus naming World Health Organization [facility]): www.who.int/emergencies/diseases/novel-coronavirus-2019/technical-guidance/naming-the-coronavirus-disease-(covid-2019)-and-the-virus-that-causes-it Who is at risk for complications from coronavirus disease? Some people may be at higher risk for complications from coronavirus disease. This includes older adults and people who have chronic diseases, such as heart disease, diabetes, and lung disease. If you are at higher risk for complications, take these extra precautions:  Stay home as much as possible.  Avoid social gatherings and travel.  Avoid close contact with others. Stay at least 6 ft (2 m) away from others, if possible.  Wash your hands often with soap and water for at least 20 seconds.  Avoid touching your face, mouth, nose, or eyes.  Keep supplies on hand at home, such as food, medicine, and cleaning supplies.  If you must go out in public, wear a cloth face covering or face mask. Make sure your mask covers your nose and mouth. How does coronavirus disease spread? The virus that causes coronavirus disease spreads easily from person to person (is contagious). You may catch the virus by:  Breathing in droplets from an infected person. Droplets can be spread by a person breathing, speaking, singing, coughing, or sneezing.  Touching something, like a table or a doorknob, that was exposed to the virus (contaminated) and then touching your mouth, nose, or eyes. Can I get the virus from touching surfaces or objects? There is still a lot that we do not know about  the virus that causes coronavirus disease. Scientists are basing a lot of information on what they know about similar viruses, such as:  Viruses cannot generally survive on surfaces for long. They need a human body (host) to survive.  It is more likely that the virus is spread by close contact with  people who are sick (direct contact), such as through: ? Shaking hands or hugging. ? Breathing in respiratory droplets that travel through the air. Droplets can be spread by a person breathing, speaking, singing, coughing, or sneezing.  It is less likely that the virus is spread when a person touches a surface or object that has the virus on it (indirect contact). The virus may be able to enter the body if the person touches a surface or object and then touches his or her face, eyes, nose, or mouth. Can a person spread the virus without having symptoms of the disease? It may be possible for the virus to spread before a person has symptoms of the disease, but this is most likely not the main way the virus is spreading. It is more likely for the virus to spread by being in close contact with people who are sick and breathing in the respiratory droplets spread by a person breathing, speaking, singing, coughing, or sneezing. What are the symptoms of coronavirus disease? Symptoms vary from person to person and can range from mild to severe. Symptoms may include:  Fever or chills.  Cough.  Difficulty breathing or feeling short of breath.  Headaches, body aches, or muscle aches.  Runny or stuffy (congested) nose.  Sore throat.  New loss of taste or smell.  Nausea, vomiting, or diarrhea. These symptoms can appear anywhere from 2 to 14 days after you have been exposed to the virus. Some people may not have any symptoms. If you develop symptoms, call your health care provider. People with severe symptoms may need hospital care. Should I be tested for this virus? Your health care provider will decide whether to test you based on your symptoms, history of exposure, and your risk factors. How does a health care provider test for this virus? Health care providers will collect samples to send for testing. Samples may include:  Taking a swab of fluid from the back of your nose and throat, your nose,  or your throat.  Taking fluid from the lungs by having you cough up mucus (sputum) into a sterile cup.  Taking a blood sample. Is there a treatment or vaccine for this virus? Currently, there is no vaccine to prevent coronavirus disease. Also, there are no medicines like antibiotics or antivirals to treat the virus. A person who becomes sick is given supportive care, which means rest and fluids. A person may also relieve his or her symptoms by using over-the-counter medicines that treat sneezing, coughing, and runny nose. These are the same medicines that a person takes for the common cold. If you develop symptoms, call your health care provider. People with severe symptoms may need hospital care. What can I do to protect myself and my family from this virus?     You can protect yourself and your family by taking the same actions that you would take to prevent the spread of other viruses. Take the following actions:  Wash your hands often with soap and water for at least 20 seconds. If soap and water are not available, use alcohol-based hand sanitizer.  Avoid touching your face, mouth, nose, or eyes.  Cough or sneeze into  a tissue, sleeve, or elbow. Do not cough or sneeze into your hand or the air. ? If you cough or sneeze into a tissue, throw it away immediately and wash your hands.  Disinfect objects and surfaces that you frequently touch every day.  Stay away from people who are sick.  Avoid going out in public, follow guidance from your state and local health authorities.  Avoid crowded indoor spaces. Stay at least 6 ft (2 m) away from others.  If you must go out in public, wear a cloth face covering or face mask. Make sure your mask covers your nose and mouth.  Stay home if you are sick, except to get medical care. Call your health care provider before you get medical care. Your health care provider will tell you how long to stay home.  Make sure your vaccines are up to date.  Ask your health care provider what vaccines you need. What should I do if I need to travel? Follow travel recommendations from your local health authority, the CDC, and WHO. Travel information and advice  Centers for Disease Control and Prevention (CDC): GeminiCard.glwww.cdc.gov/coronavirus/2019-ncov/travelers/index.html  World Health Organization [facility](WHO): PreviewDomains.sewww.who.int/emergencies/diseases/novel-coronavirus-2019/travel-advice Know the risks and take action to protect your health  You are at higher risk of getting coronavirus disease if you are traveling to areas with an outbreak or if you are exposed to travelers from areas with an outbreak.  Wash your hands often and practice good hygiene to lower the risk of catching or spreading the virus. What should I do if I am sick? General instructions to stop the spread of infection  Wash your hands often with soap and water for at least 20 seconds. If soap and water are not available, use alcohol-based hand sanitizer.  Cough or sneeze into a tissue, sleeve, or elbow. Do not cough or sneeze into your hand or the air.  If you cough or sneeze into a tissue, throw it away immediately and wash your hands.  Stay home unless you must get medical care. Call your health care provider or local health authority before you get medical care.  Avoid public areas. Do not take public transportation, if possible.  If you can, wear a mask if you must go out of the house or if you are in close contact with someone who is not sick. Make sure your mask covers your nose and mouth. Keep your home clean  Disinfect objects and surfaces that are frequently touched every day. This may include: ? Counters and tables. ? Doorknobs and light switches. ? Sinks and faucets. ? Electronics such as phones, remote controls, keyboards, computers, and tablets.  Wash dishes in hot, soapy water or use a dishwasher. Air-dry your dishes.  Wash laundry in hot water. Prevent infecting other  household members  Let healthy household members care for children and pets, if possible. If you have to care for children or pets, wash your hands often and wear a mask.  Sleep in a different bedroom or bed, if possible.  Do not share personal items, such as razors, toothbrushes, deodorant, combs, brushes, towels, and washcloths. Where to find more information Centers for Disease Control and Prevention (CDC)  Information and news updates: CardRetirement.czwww.cdc.gov/coronavirus/2019-ncov World Health Organization [facility](WHO)  Information and news updates: AffordableSalon.eswww.who.int/emergencies/diseases/novel-coronavirus-2019  Coronavirus health topic: https://thompson-craig.com/www.who.int/health-topics/coronavirus  Questions and answers on COVID-19: kruiseway.comwww.who.int/news-room/q-a-detail/q-a-coronaviruses  Global tracker: who.sprinklr.com American Academy of Pediatrics (AAP)  Information for families: www.healthychildren.org/English/health-issues/conditions/chest-lungs/Pages/2019-Novel-Coronavirus.aspx The coronavirus situation is changing rapidly. Check your local health authority website or  the CDC and WHO websites for updates and news. When should I contact a health care provider?  Contact your health care provider if you have symptoms of an infection, such as fever or cough, and you: ? Have been near anyone who is known to have coronavirus disease. ? Have come into contact with a person who is suspected to have coronavirus disease. ? Have traveled to an area where there is an outbreak of COVID-19. When should I get emergency medical care?  Get help right away by calling your local emergency services (911 in the [place] if you have: ? Trouble breathing. ? Pain or pressure in your chest. ? Confusion. ? Blue-tinged lips and fingernails. ? Difficulty waking from sleep. ? Symptoms that get worse. Let the emergency medical personnel know if you think you have coronavirus disease. Summary  A new respiratory virus is spreading from person to  person and causing COVID-19 (coronavirus disease).  The virus that causes COVID-19 appears to spread easily. It spreads from one person to another through droplets from breathing, speaking, singing, coughing, or sneezing.  Older adults and those with chronic diseases are at higher risk of disease. If you are at higher risk for complications, take extra precautions.  There is currently no vaccine to prevent coronavirus disease. There are no medicines, such as antibiotics or antivirals, to treat the virus.  You can protect yourself and your family by washing your hands often, avoiding touching your face, and covering your coughs and sneezes. This information is not intended to replace advice given to you by your health care provider. Make sure you discuss any questions you have with your health care provider. Document Revised: 07/27/2019 Document Reviewed: 01/23/2019 Elsevier Patient Education  2020 Elsevier Inc.  COVID-19 COVID-19 is a respiratory infection that is caused by a virus called severe acute respiratory syndrome coronavirus 2 (SARS-CoV-2). The disease is also known as coronavirus disease or novel coronavirus. In some people, the virus may not cause any symptoms. In others, it may cause a serious infection. The infection can get worse quickly and can lead to complications, such as:  Pneumonia, or infection of the lungs.  Acute respiratory distress syndrome or ARDS. This is a condition in which fluid build-up in the lungs prevents the lungs from filling with air and passing oxygen into the blood.  Acute respiratory failure. This is a condition in which there is not enough oxygen passing from the lungs to the body or when carbon dioxide is not passing from the lungs out of the body.  Sepsis or septic shock. This is a serious bodily reaction to an infection.  Blood clotting problems.  Secondary infections due to bacteria or fungus.  Organ failure. This is when your body's organs  stop working. The virus that causes COVID-19 is contagious. This means that it can spread from person to person through droplets from coughs and sneezes (respiratory secretions). What are the causes? This illness is caused by a virus. You may catch the virus by:  Breathing in droplets from an infected person. Droplets can be spread by a person breathing, speaking, singing, coughing, or sneezing.  Touching something, like a table or a doorknob, that was exposed to the virus (contaminated) and then touching your mouth, nose, or eyes. What increases the risk? Risk for infection You are more likely to be infected with this virus if you:  Are within 6 feet (2 meters) of a person with COVID-19.  Provide care for or live with a  person who is infected with COVID-19.  Spend time in crowded indoor spaces or live in shared housing. Risk for serious illness You are more likely to become seriously ill from the virus if you:  Are 66 years of age or older. The higher your age, the more you are at risk for serious illness.  Live in a nursing home or long-term care facility.  Have cancer.  Have a long-term (chronic) disease such as: ? Chronic lung disease, including chronic obstructive pulmonary disease or asthma. ? A long-term disease that lowers your body's ability to fight infection (immunocompromised). ? Heart disease, including heart failure, a condition in which the arteries that lead to the heart become narrow or blocked (coronary artery disease), a disease which makes the heart muscle thick, weak, or stiff (cardiomyopathy). ? Diabetes. ? Chronic kidney disease. ? Sickle cell disease, a condition in which red blood cells have an abnormal "sickle" shape. ? Liver disease.  Are obese. What are the signs or symptoms? Symptoms of this condition can range from mild to severe. Symptoms may appear any time from 2 to 14 days after being exposed to the virus. They include:  A fever or chills.  A  cough.  Difficulty breathing.  Headaches, body aches, or muscle aches.  Runny or stuffy (congested) nose.  A sore throat.  New loss of taste or smell. Some people may also have stomach problems, such as nausea, vomiting, or diarrhea. Other people may not have any symptoms of COVID-19. How is this diagnosed? This condition may be diagnosed based on:  Your signs and symptoms, especially if: ? You live in an area with a COVID-19 outbreak. ? You recently traveled to or from an area where the virus is common. ? You provide care for or live with a person who was diagnosed with COVID-19. ? You were exposed to a person who was diagnosed with COVID-19.  A physical exam.  Lab tests, which may include: ? Taking a sample of fluid from the back of your nose and throat (nasopharyngeal fluid), your nose, or your throat using a swab. ? A sample of mucus from your lungs (sputum). ? Blood tests.  Imaging tests, which may include, X-rays, CT scan, or ultrasound. How is this treated? At present, there is no medicine to treat COVID-19. Medicines that treat other diseases are being used on a trial basis to see if they are effective against COVID-19. Your health care provider will talk with you about ways to treat your symptoms. For most people, the infection is mild and can be managed at home with rest, fluids, and over-the-counter medicines. Treatment for a serious infection usually takes places in a hospital intensive care unit (ICU). It may include one or more of the following treatments. These treatments are given until your symptoms improve.  Receiving fluids and medicines through an IV.  Supplemental oxygen. Extra oxygen is given through a tube in the nose, a face mask, or a hood.  Positioning you to lie on your stomach (prone position). This makes it easier for oxygen to get into the lungs.  Continuous positive airway pressure (CPAP) or bi-level positive airway pressure (BPAP) machine. This  treatment uses mild air pressure to keep the airways open. A tube that is connected to a motor delivers oxygen to the body.  Ventilator. This treatment moves air into and out of the lungs by using a tube that is placed in your windpipe.  Tracheostomy. This is a procedure to create a hole  in the neck so that a breathing tube can be inserted.  Extracorporeal membrane oxygenation (ECMO). This procedure gives the lungs a chance to recover by taking over the functions of the heart and lungs. It supplies oxygen to the body and removes carbon dioxide. Follow these instructions at home: Lifestyle  If you are sick, stay home except to get medical care. Your health care provider will tell you how long to stay home. Call your health care provider before you go for medical care.  Rest at home as told by your health care provider.  Do not use any products that contain nicotine or tobacco, such as cigarettes, e-cigarettes, and chewing tobacco. If you need help quitting, ask your health care provider.  Return to your normal activities as told by your health care provider. Ask your health care provider what activities are safe for you. General instructions  Take over-the-counter and prescription medicines only as told by your health care provider.  Drink enough fluid to keep your urine pale yellow.  Keep all follow-up visits as told by your health care provider. This is important. How is this prevented?  There is no vaccine to help prevent COVID-19 infection. However, there are steps you can take to protect yourself and others from this virus. To protect yourself:   Do not travel to areas where COVID-19 is a risk. The areas where COVID-19 is reported change often. To identify high-risk areas and travel restrictions, check the CDC travel website: StageSync.si  If you live in, or must travel to, an area where COVID-19 is a risk, take precautions to avoid infection. ? Stay away from  people who are sick. ? Wash your hands often with soap and water for 20 seconds. If soap and water are not available, use an alcohol-based hand sanitizer. ? Avoid touching your mouth, face, eyes, or nose. ? Avoid going out in public, follow guidance from your state and local health authorities. ? If you must go out in public, wear a cloth face covering or face mask. Make sure your mask covers your nose and mouth. ? Avoid crowded indoor spaces. Stay at least 6 feet (2 meters) away from others. ? Disinfect objects and surfaces that are frequently touched every day. This may include:  Counters and tables.  Doorknobs and light switches.  Sinks and faucets.  Electronics, such as phones, remote controls, keyboards, computers, and tablets. To protect others: If you have symptoms of COVID-19, take steps to prevent the virus from spreading to others.  If you think you have a COVID-19 infection, contact your health care provider right away. Tell your health care team that you think you may have a COVID-19 infection.  Stay home. Leave your house only to seek medical care. Do not use public transport.  Do not travel while you are sick.  Wash your hands often with soap and water for 20 seconds. If soap and water are not available, use alcohol-based hand sanitizer.  Stay away from other members of your household. Let healthy household members care for children and pets, if possible. If you have to care for children or pets, wash your hands often and wear a mask. If possible, stay in your own room, separate from others. Use a different bathroom.  Make sure that all people in your household wash their hands well and often.  Cough or sneeze into a tissue or your sleeve or elbow. Do not cough or sneeze into your hand or into the air.  Wear a  cloth face covering or face mask. Make sure your mask covers your nose and mouth. Where to find more information  Centers for Disease Control and Prevention:  StickerEmporium.tn  World Health Organization: https://thompson-craig.com/ Contact a health care provider if:  You live in or have traveled to an area where COVID-19 is a risk and you have symptoms of the infection.  You have had contact with someone who has COVID-19 and you have symptoms of the infection. Get help right away if:  You have trouble breathing.  You have pain or pressure in your chest.  You have confusion.  You have bluish lips and fingernails.  You have difficulty waking from sleep.  You have symptoms that get worse. These symptoms may represent a serious problem that is an emergency. Do not wait to see if the symptoms will go away. Get medical help right away. Call your local emergency services (911 in the [place] Do not drive yourself to the hospital. Let the emergency medical personnel know if you think you have COVID-19. Summary  COVID-19 is a respiratory infection that is caused by a virus. It is also known as coronavirus disease or novel coronavirus. It can cause serious infections, such as pneumonia, acute respiratory distress syndrome, acute respiratory failure, or sepsis.  The virus that causes COVID-19 is contagious. This means that it can spread from person to person through droplets from breathing, speaking, singing, coughing, or sneezing.  You are more likely to develop a serious illness if you are 10 years of age or older, have a weak immune system, live in a nursing home, or have chronic disease.  There is no medicine to treat COVID-19. Your health care provider will talk with you about ways to treat your symptoms.  Take steps to protect yourself and others from infection. Wash your hands often and disinfect objects and surfaces that are frequently touched every day. Stay away from people who are sick and wear a mask if you are sick. This information is not intended to replace advice given to you by your health care  provider. Make sure you discuss any questions you have with your health care provider. Document Revised: 07/27/2019 Document Reviewed: 11/02/2018 Elsevier Patient Education  2020 [employer].

## 2020-07-15 ENCOUNTER — Telehealth: Payer: Self-pay

## 2020-07-15 NOTE — Telephone Encounter (Signed)
Pt called clinic requesting rapid covid testing; states she has cold like symptoms- sore throat, nasal congestion and slight cough since Saturday; denies any fever or SOB; no known Covid exposure; has not traveled in the last 14 days; pt is  vaccinated; states asymptomatic testing done last Thursday and was negative; states husband also has similar symptoms and was tested today and awaiting results; discussed with H. RAtcliffe, PAC and she recommended getting tested on day 5 which would be Thursday; called pt back to inform her of need to be tested Thursday and to isolate until she gets tested and pt said she would  Do the best she could but was going to go administer a test and had another class this afternoon

## 2021-03-25 IMAGING — MR MR LUMBAR SPINE W/O CM
4 of 5 series · 25 of 48 positions shown · non-contrast
Comparison: Lumbar MRI 01/23/2017

CLINICAL DATA: Back pain with right leg pain. No prior back
surgery.

EXAM:
MRI LUMBAR SPINE WITHOUT CONTRAST
TECHNIQUE: Multiplanar, multisequence MR imaging of the lumbar spine was
performed. No intravenous contrast was administered.

[Series 2: T2 · sagittal · 4.0mm · 0.55mm/px · 6 of 16 slices shown (1 of 2)]
[im 1/16]
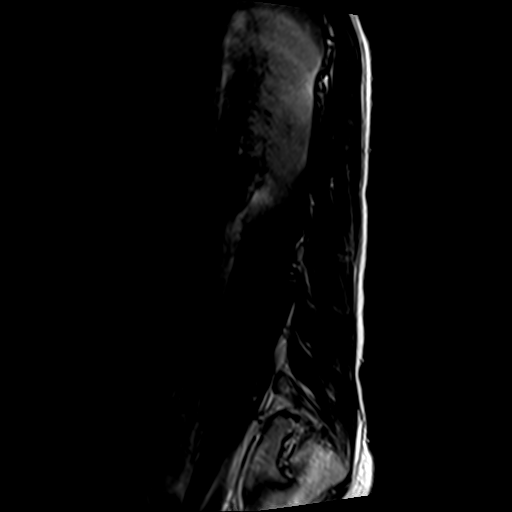
[im 4/16]
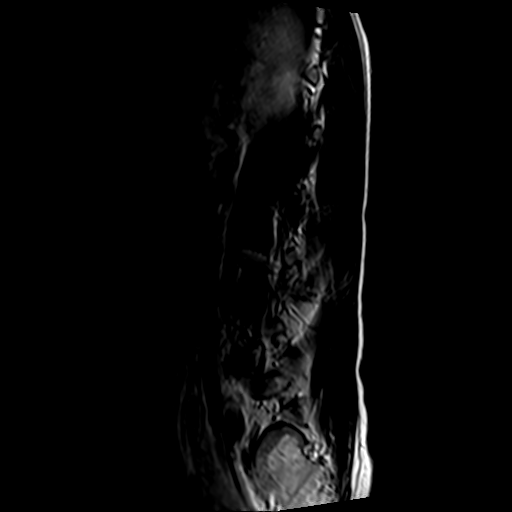
[im 7/16]
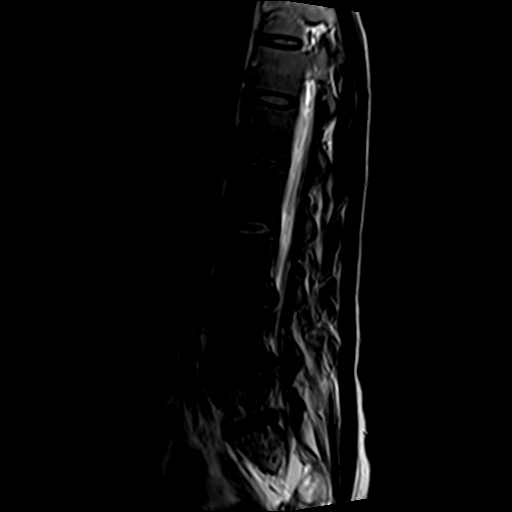
[im 10/16]
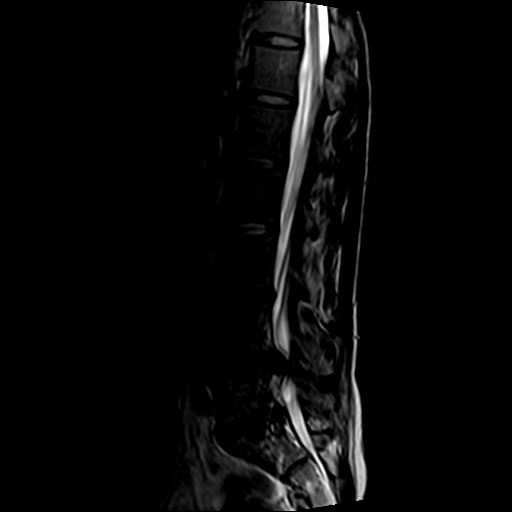
[im 13/16]
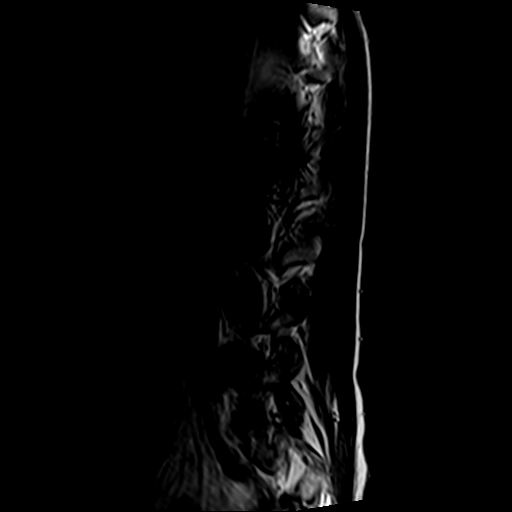
[im 16/16]
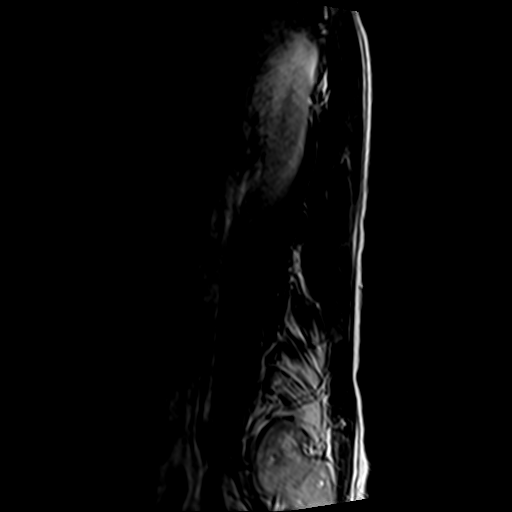

[Series 4: T1 · sagittal · 4.0mm · 0.55mm/px · 6 of 16 slices shown (1 of 2)]
[im 1/16]
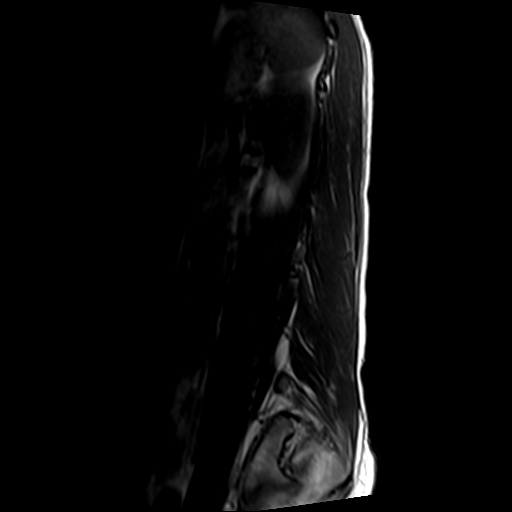
[im 4/16]
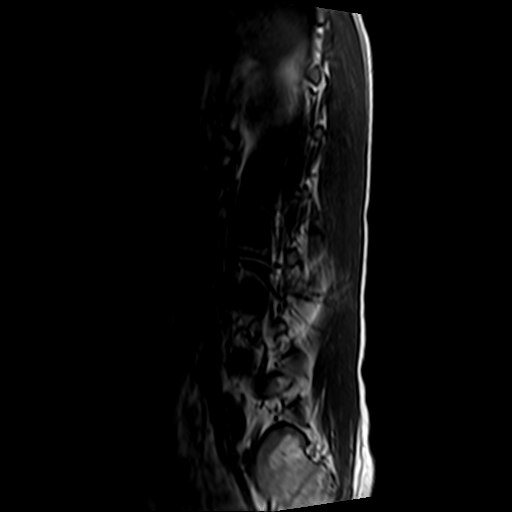
[im 7/16]
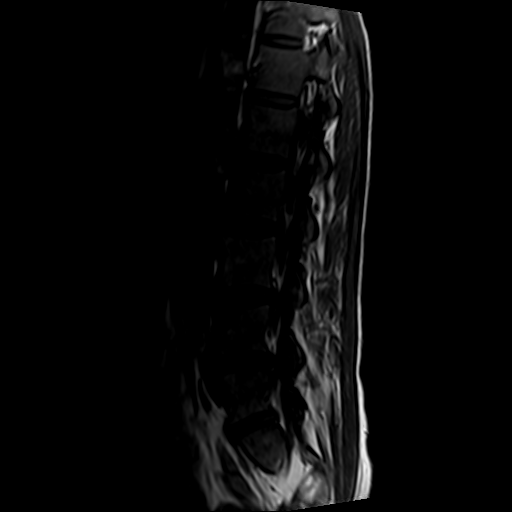
[im 10/16]
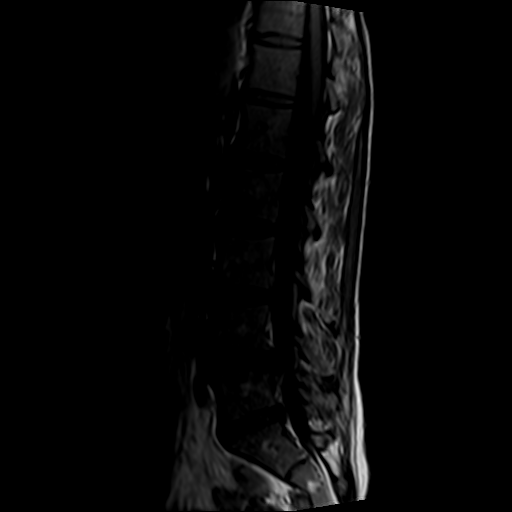
[im 13/16]
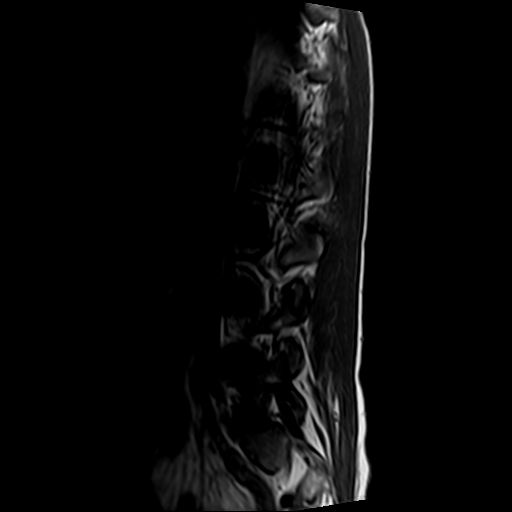
[im 16/16]
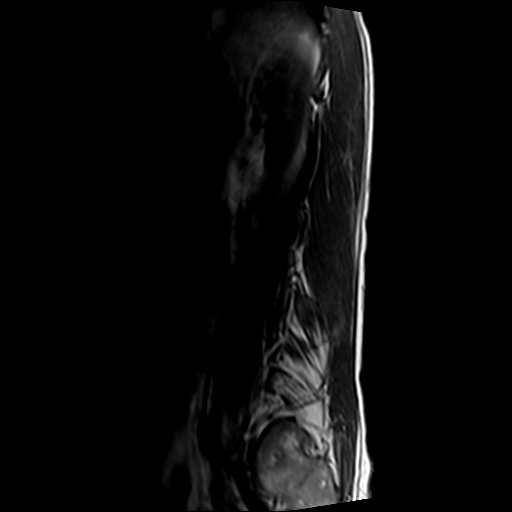

[Series 5: T2 · axial · 4.0mm · 0.70mm/px · z∈[-55,+153]mm · 9 of 37 slices shown (2 of 2)]
[im 1/37]
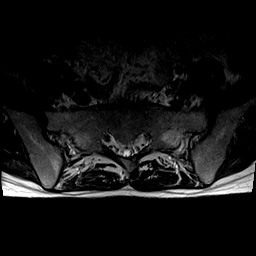
[im 6/37]
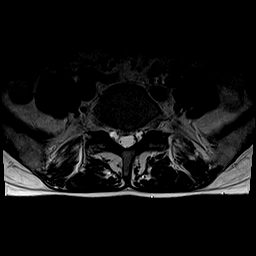
[im 11/37]
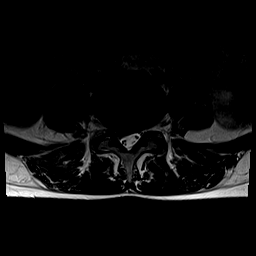
[im 16/37]
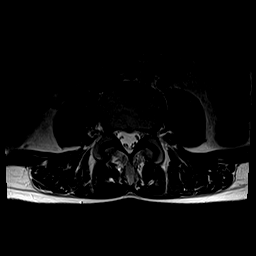
[im 19/37]
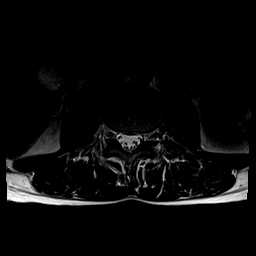
[im 21/37]
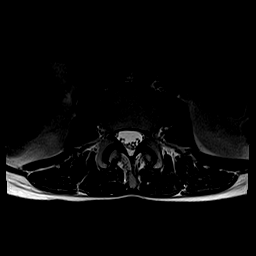
[im 26/37]
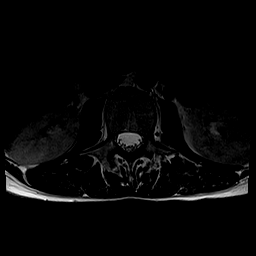
[im 31/37]
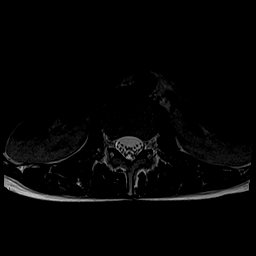
[im 37/37]
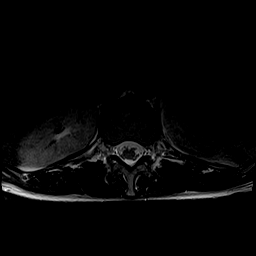

[Series 6: T1 · axial · 4.0mm · 0.35mm/px · z∈[-55,+122]mm · 4 of 37 slices shown (2 of 2)]
[im 1/37]
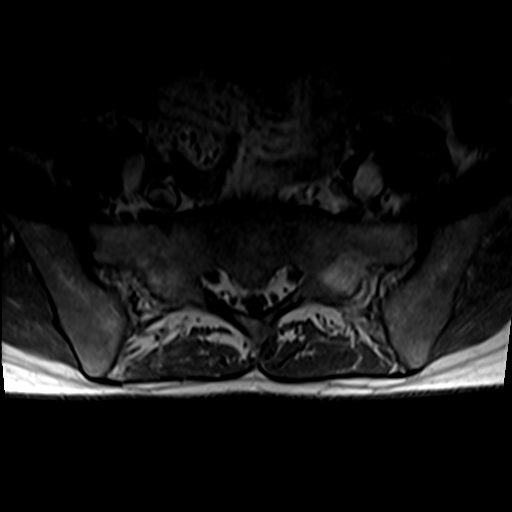
[im 6/37]
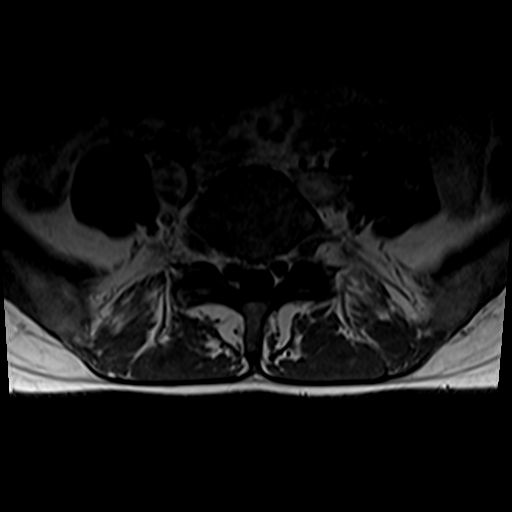
[im 19/37]
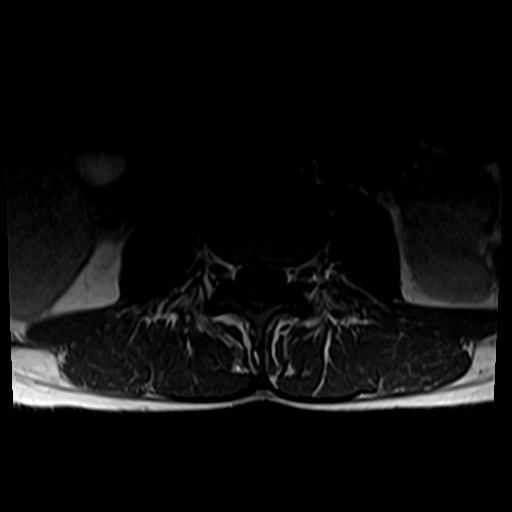
[im 31/37]
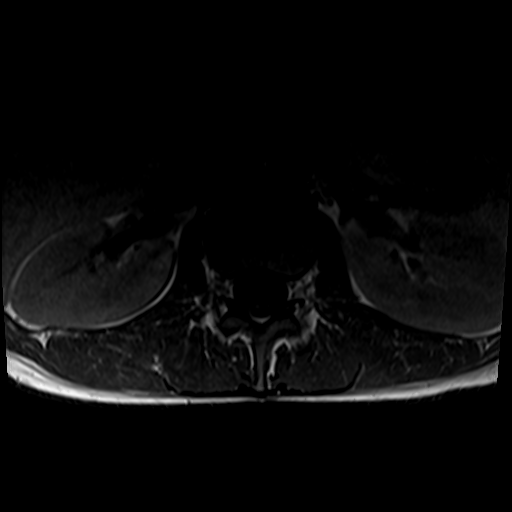

[25 of 48 positions shown; findings below may reference images not displayed]

FINDINGS: Segmentation:  Normal

Alignment:  Normal

Vertebrae:  Normal bone marrow.  Negative for fracture or mass.

Conus medullaris and cauda equina: Conus extends to the L1 level.
Conus and cauda equina appear normal.

Paraspinal and other soft tissues: Negative for paraspinous mass or
adenopathy

Disc levels:

L1-2: Negative

L2-3: Normal disc space.  Mild facet degeneration without stenosis

L3-4: Small central disc protrusion has improved in the interval.
Bilateral facet degeneration. No significant stenosis

L4-5: Large extruded disc fragment on the right has developed since
the prior study. Previously there was central and right-sided disc
protrusion. There is compression of the thecal sac and right L5
nerve root in the subarticular zone. Mild spinal stenosis

L5-S1: Small central disc protrusion, with significant improvement
since the prior MRI. Slight flattening of the left S1 nerve root
with improvement since the prior study.
IMPRESSION: 1. Small central disc protrusion L3-4 with interval improvement
2. Large extruded disc fragment on the right L4-5 causing spinal
stenosis and right L5 nerve root impingement
3. Small central disc protrusion L5-S1 with significant improvement
since the prior MRI.

## 2022-02-25 ENCOUNTER — Other Ambulatory Visit: Payer: Self-pay | Admitting: Student

## 2022-02-25 DIAGNOSIS — M542 Cervicalgia: Secondary | ICD-10-CM

## 2022-02-25 DIAGNOSIS — M5410 Radiculopathy, site unspecified: Secondary | ICD-10-CM

## 2022-03-04 ENCOUNTER — Other Ambulatory Visit: Payer: BC Managed Care – PPO

## 2022-03-10 ENCOUNTER — Other Ambulatory Visit: Payer: BC Managed Care – PPO
